# Patient Record
Sex: Female | Born: 1965 | Race: White | Hispanic: No | Marital: Married | Smoking: Never smoker
Health system: Southern US, Community
[De-identification: ages and names within clinical notes are randomized; demographics above are authoritative.]

## PROBLEM LIST (undated history)

## (undated) DIAGNOSIS — I10 Essential (primary) hypertension: Secondary | ICD-10-CM

## (undated) HISTORY — PX: DILATION AND CURETTAGE OF UTERUS: SHX78

## (undated) HISTORY — DX: Essential (primary) hypertension: I10

---

## 1998-03-14 ENCOUNTER — Encounter: Admission: RE | Admit: 1998-03-14 | Discharge: 1998-06-12 | Payer: Self-pay | Admitting: Orthopedic Surgery

## 1999-01-19 ENCOUNTER — Encounter: Admission: RE | Admit: 1999-01-19 | Discharge: 1999-02-06 | Payer: Self-pay | Admitting: Orthopedic Surgery

## 2002-03-31 ENCOUNTER — Emergency Department (HOSPITAL_COMMUNITY): Admission: EM | Admit: 2002-03-31 | Discharge: 2002-03-31 | Payer: Self-pay | Admitting: Emergency Medicine

## 2005-09-23 ENCOUNTER — Other Ambulatory Visit: Admission: RE | Admit: 2005-09-23 | Discharge: 2005-09-23 | Payer: Self-pay | Admitting: Obstetrics and Gynecology

## 2011-03-31 ENCOUNTER — Other Ambulatory Visit: Payer: Self-pay | Admitting: Obstetrics and Gynecology

## 2011-03-31 ENCOUNTER — Ambulatory Visit (HOSPITAL_COMMUNITY)
Admission: RE | Admit: 2011-03-31 | Discharge: 2011-03-31 | Disposition: A | Discharge status: Home / Self Care | Payer: 59 | Source: Ambulatory Visit | Attending: Obstetrics and Gynecology | Admitting: Obstetrics and Gynecology

## 2011-03-31 DIAGNOSIS — O021 Missed abortion: Secondary | ICD-10-CM | POA: Insufficient documentation

## 2011-03-31 LAB — CBC
HCT: 37.4 % (ref 36.0–46.0)
Hemoglobin: 12.7 g/dL (ref 12.0–15.0)
MCV: 92.6 fL (ref 78.0–100.0)
RBC: 4.04 MIL/uL (ref 3.87–5.11)
WBC: 5.2 10*3/uL (ref 4.0–10.5)

## 2011-04-02 NOTE — Op Note (Signed)
  NAMESHIRELY, TOREN NO.:  [id number]  MEDICAL RECORD NO.:  [id number]           PATIENT TYPE:  O  LOCATION:  [facility]                          [facility]  PHYSICIAN:  Zelphia Cairo, MD    DATE OF BIRTH:  02/15/1966  DATE OF PROCEDURE:  03/31/2011 DATE OF DISCHARGE:                              OPERATIVE REPORT   PREOPERATIVE DIAGNOSIS:  Missed abortion.  POSTOPERATIVE DIAGNOSIS:  Missed abortion.  PROCEDURES: 1. Cervical block. 2. D and E.  SURGEON:  Zelphia Cairo, MD  ANESTHESIA:  MAC with local.  SPECIMEN:  Products of conception.  ESTIMATED BLOOD LOSS:  Minimal.  COMPLICATIONS:  None.  CONDITION:  Stable to recovery room.  DESCRIPTION OF PROCEDURE:  The patient was taken to the operating room where anesthesia was found to be adequate.  She was placed in the dorsal lithotomy position using Allen stirrups, prepped and draped in sterile fashion.  An in-and-out catheter was used to drain her bladder.  Bivalve speculum was placed in the vagina.  A 1 mL of 1% lidocaine was placed at the anterior lip of the cervix and a single-tooth tenaculum was placed on the anterior lip of the cervix.  A 9 mL of 1% lidocaine was used then used to provide a cervical block.  The cervix was then serially dilated using Pratt dilators and the suction curette was used to remove products of conception.  A gentle curetting was then performed.  Once the uterine cry was ensured, one last pass with the suction catheter was performed to remove any clots and debris.  Tenaculum was removed from the cervix. The cervix was hemostatic.  The speculum was removed.  The patient was taken to the recovery room in stable condition.    Zelphia Cairo, MD    GA/MEDQ  D:  03/31/2011  T:  04/01/2011  Job:  [id number]  Electronically Signed by Zelphia Cairo MD on 04/02/2011 11:11:42 AM

## 2011-05-23 ENCOUNTER — Encounter: Payer: Self-pay | Admitting: *Deleted

## 2011-05-23 ENCOUNTER — Emergency Department (HOSPITAL_BASED_OUTPATIENT_CLINIC_OR_DEPARTMENT_OTHER)
Admission: EM | Admit: 2011-05-23 | Discharge: 2011-05-24 | Disposition: A | Discharge status: Home / Self Care | Payer: 59 | Attending: Emergency Medicine | Admitting: Emergency Medicine

## 2011-05-23 DIAGNOSIS — N898 Other specified noninflammatory disorders of vagina: Secondary | ICD-10-CM | POA: Insufficient documentation

## 2011-05-23 DIAGNOSIS — N939 Abnormal uterine and vaginal bleeding, unspecified: Secondary | ICD-10-CM

## 2011-05-23 LAB — DIFFERENTIAL
Basophils Absolute: 0 10*3/uL (ref 0.0–0.1)
Basophils Relative: 0 % (ref 0–1)
Eosinophils Absolute: 0.1 10*3/uL (ref 0.0–0.7)
Eosinophils Relative: 3 % (ref 0–5)
Lymphocytes Relative: 50 % — ABNORMAL HIGH (ref 12–46)
Lymphs Abs: 2.4 10*3/uL (ref 0.7–4.0)
Monocytes Absolute: 0.5 10*3/uL (ref 0.1–1.0)
Monocytes Relative: 11 % (ref 3–12)
Neutro Abs: 1.7 10*3/uL (ref 1.7–7.7)
Neutrophils Relative %: 36 % — ABNORMAL LOW (ref 43–77)

## 2011-05-23 LAB — URINALYSIS, ROUTINE W REFLEX MICROSCOPIC
Bilirubin Urine: NEGATIVE
Glucose, UA: NEGATIVE mg/dL
Hgb urine dipstick: NEGATIVE
Ketones, ur: NEGATIVE mg/dL
Leukocytes, UA: NEGATIVE
Nitrite: NEGATIVE
Protein, ur: NEGATIVE mg/dL
Specific Gravity, Urine: 1.01 (ref 1.005–1.030)
Urobilinogen, UA: 0.2 mg/dL (ref 0.0–1.0)
pH: 6.5 (ref 5.0–8.0)

## 2011-05-23 LAB — WET PREP, GENITAL
Clue Cells Wet Prep HPF POC: NONE SEEN
Trich, Wet Prep: NONE SEEN
Yeast Wet Prep HPF POC: NONE SEEN

## 2011-05-23 LAB — CBC
MCH: 31.2 pg (ref 26.0–34.0)
MCHC: 34.7 g/dL (ref 30.0–36.0)
MCV: 89.8 fL (ref 78.0–100.0)
Platelets: 217 10*3/uL (ref 150–400)
RBC: 4.01 MIL/uL (ref 3.87–5.11)

## 2011-05-23 LAB — PREGNANCY, URINE: Preg Test, Ur: NEGATIVE

## 2011-05-23 NOTE — ED Provider Notes (Signed)
History     Chief Complaint  Patient presents with  . Vaginal Bleeding   Patient is a 45 y.o. female presenting with vaginal bleeding.  Vaginal Bleeding This is a new problem. Episode onset: 3 days  ago. The problem occurs constantly. The problem has been gradually improving. Pertinent negatives include no abdominal pain, anorexia, chest pain, fatigue, fever, headaches, nausea, numbness, urinary symptoms, vertigo, vomiting or weakness. The symptoms are aggravated by nothing.  Vaginal Bleeding This is a new problem. Episode onset: 3 days  ago. The problem occurs constantly. The problem has been gradually improving. Pertinent negatives include no chest pain, no abdominal pain and no headaches. The symptoms are aggravated by nothing.  States she had a missed miscarraige and a D&C in May 2012. Reports recently diagnosed with BV and treated for it. States since she she began the ABx she has had vaginal bleeding. States that Reports yesterday using 2 "super" tampons an hour. States that today bleeding seemed to improve. But was recommended by the Dr. Garlan Fillers triage nurse to come to ED for evaluation of anemia. Dr. Garlan Fillers has her scheduled for an [place] in 2 weeks. LMP was 2 weeks ago  History reviewed. No pertinent past medical history.  Past Surgical History  Procedure Date  . Dilation and curettage of uterus     History reviewed. No pertinent family history.  History  Substance Use Topics  . Smoking status: Never Smoker   . Smokeless tobacco: Not on file  . Alcohol Use: Yes     occassional    OB History    Grav Para Term Preterm Abortions TAB SAB Ect Mult Living                  Review of Systems  Constitutional: Negative for fever and fatigue.  Cardiovascular: Negative for chest pain.  Gastrointestinal: Negative for nausea, vomiting, abdominal pain and anorexia.  Genitourinary: Positive for vaginal bleeding. Negative for dysuria, urgency, frequency, flank pain, vaginal pain and  pelvic pain.  Musculoskeletal: Negative for back pain.  Neurological: Negative for vertigo, weakness, light-headedness, numbness and headaches.    Physical Exam  BP 141/68  Pulse 72  Temp(Src) 98.6 F (37 C) (Oral)  Resp 20  SpO2 100%  Physical Exam  Constitutional: She is oriented to person, place, and time. She appears well-developed and well-nourished.  HENT:  Head: Atraumatic.  Eyes: Conjunctivae and EOM are normal. Pupils are equal, round, and reactive to light.  Neck: Normal range of motion. Neck supple.  Cardiovascular: Normal rate, regular rhythm and normal heart sounds.  Exam reveals no friction rub.   No murmur heard. Pulmonary/Chest: Effort normal and breath sounds normal. She has no wheezes. She has no rales. She exhibits no tenderness.  Abdominal: Soft. Bowel sounds are normal. She exhibits no distension and no mass. There is no tenderness. There is no rebound and no guarding.  Genitourinary: There is no tenderness or lesion on the right labia. There is no tenderness or lesion on the left labia. Uterus is enlarged. Uterus is not tender. Right adnexum displays no mass and no tenderness. Left adnexum displays no mass and no tenderness. There is bleeding around the vagina. No tenderness around the vagina.       Moderate amount of bright red blood in the vagina. No lacerations, or masses. Cervix pale. Os closed.   Musculoskeletal: Normal range of motion.  Neurological: She is alert and oriented to person, place, and time. Coordination normal.  Skin: Skin is  warm and dry. No rash noted. No erythema. No pallor.    ED Course  Procedures  MDM Labs WNL. Will schedule [place] for tomorrow. Uterus appears enlarged slightly on exam. Advised patient to return for worsened bleeding, dizziness, nausea, and abdominal/ pelvic pain. Pt and family agree on plan and are ready for dc   Medical screening examination/treatment/procedure(s) were performed by non-physician practitioner and as  supervising physician I was immediately available for consultation/collaboration.    Monica Best, [place] 05/26/11 1037  Shelda Jakes, MD 06/25/11 [phone]

## 2011-05-23 NOTE — ED Notes (Signed)
Pt state that she was treated for BV and since has had vaginal bleeding and painful intercourse pt called her oncall RN and was advised to come to ED

## 2011-05-24 ENCOUNTER — Ambulatory Visit (HOSPITAL_BASED_OUTPATIENT_CLINIC_OR_DEPARTMENT_OTHER)
Admission: RE | Admit: 2011-05-24 | Discharge: 2011-05-24 | Disposition: A | Discharge status: Home / Self Care | Payer: 59 | Source: Ambulatory Visit | Attending: Emergency Medicine | Admitting: Emergency Medicine

## 2011-05-24 ENCOUNTER — Ambulatory Visit (INDEPENDENT_AMBULATORY_CARE_PROVIDER_SITE_OTHER)
Admit: 2011-05-24 | Discharge: 2011-05-24 | Disposition: A | Discharge status: Home / Self Care | Payer: 59 | Attending: Emergency Medicine | Admitting: Emergency Medicine

## 2011-05-24 DIAGNOSIS — N898 Other specified noninflammatory disorders of vagina: Secondary | ICD-10-CM

## 2011-05-24 DIAGNOSIS — N854 Malposition of uterus: Secondary | ICD-10-CM | POA: Insufficient documentation

## 2011-05-24 DIAGNOSIS — N83209 Unspecified ovarian cyst, unspecified side: Secondary | ICD-10-CM | POA: Insufficient documentation

## 2011-05-25 LAB — GC/CHLAMYDIA PROBE AMP, GENITAL: Chlamydia, DNA Probe: NEGATIVE

## 2011-06-22 ENCOUNTER — Other Ambulatory Visit (HOSPITAL_COMMUNITY): Payer: Self-pay | Admitting: Gynecology

## 2011-06-22 DIAGNOSIS — N979 Female infertility, unspecified: Secondary | ICD-10-CM

## 2011-06-29 ENCOUNTER — Ambulatory Visit (HOSPITAL_COMMUNITY): Payer: 59

## 2011-06-29 ENCOUNTER — Ambulatory Visit (HOSPITAL_COMMUNITY)
Admission: RE | Admit: 2011-06-29 | Discharge: 2011-06-29 | Disposition: A | Discharge status: Home / Self Care | Payer: 59 | Source: Ambulatory Visit | Attending: Gynecology | Admitting: Gynecology

## 2011-06-29 DIAGNOSIS — N979 Female infertility, unspecified: Secondary | ICD-10-CM | POA: Insufficient documentation

## 2011-06-29 MED ORDER — IOHEXOL 300 MG/ML  SOLN: 30.0000 mL | Freq: Once | INTRAMUSCULAR | Status: AC | PRN

## 2014-08-01 ENCOUNTER — Other Ambulatory Visit: Payer: Self-pay | Admitting: Obstetrics and Gynecology

## 2014-08-02 LAB — CYTOLOGY - PAP

## 2018-01-16 NOTE — Progress Notes (Signed)
Cardiology Office Note   Date:  01/20/2018   ID:  Monica SaltJennifer Dameron Carriker, DOB February 17, 1966, MRN [id number]  PCP: Seabron SpatesNancy Yount NP [place] Cardiologist:   Gae Bihl [place], MD   Chief Complaint  Patient presents with  . Follow-up    NP.  Marland Kitchen. Chest Pain  . Headache  . Shortness of Breath  . Hypertension      History of Present Illness: Monica Best is a 52 y.o. female who is seen at the request of Lajuana [place]atalie [place]  for evaluation of HTN and chest pain. She states she was in good health until October 2018 when she saw her eye doctor and was noted to have severe HTN with BP 220/100. She states previously her BP was low during pregnancies. She was started on BP medication initially with lisinopril and then later switched to Benicar. Was seen by Dr. Gwenette GreetMichael Conley [place]. Medication dose increased. Later amlodipine and HCTZ added.   She states she just doses not feel well. Complaints of chest pain and tightness in the center of her chest radiating to her jaw and side of neck. She has SOB just talking sometimes. She feels heart racing at times with her Apple watch showing increased HR to 150. Further work up by primary care includes renal duplex. She also just wore a 7 day heart monitor. Results are not known although patient reports kidney scan was OK. She owns a [employer]. She eats very healthy and avoids salt. She used to exercise daily but hasn't recently.   With the recent change in her medication her BP control has improved. She brings a diary indicating systolic readings of 125-148 and diastolic 75-97 the past 2-3 weeks. She does have a family history of early CAD with father having a cardiac event at age 50. She has no history of DM, HLD, or smoking.     Past Medical History:  Diagnosis Date  . Hypertension     Past Surgical History:  Procedure Laterality Date  . DILATION AND CURETTAGE OF UTERUS       Current Outpatient Medications    Medication Sig Dispense Refill  . butalbital-acetaminophen-caffeine (FIORICET, ESGIC) 50-325-40 MG tablet Take 1 tablet by mouth daily as needed.    Marland Kitchen. amLODipine (NORVASC) 10 MG tablet Take 10 mg by mouth daily.  0  . hydrochlorothiazide (HYDRODIURIL) 12.5 MG tablet Take 12.5 mg by mouth daily.  0  . Multiple Vitamin (MULTIVITAMIN) tablet Take 1 tablet by mouth daily.    Marland Kitchen. olmesartan (BENICAR) 40 MG tablet Take 40 mg by mouth daily.  0   No current facility-administered medications for this visit.     Allergies:   Patient has no known allergies.    Social History:  The patient  reports that  has never smoked. she has never used smokeless tobacco. She reports that she drinks about 0.6 oz of alcohol per week. She reports that she does not use drugs.   Family History:  The patient's family history includes Cancer in her brother; Diabetes in her father; Heart attack in her father; Heart disease (age of onset: 10052) in her father; Hypertension in her mother.    ROS:  Please see the history of present illness.   Otherwise, review of systems are positive for none.   All other systems are reviewed and negative.    PHYSICAL EXAM: VS:  BP (!) 142/70   Pulse 72   Ht 5\' 4"  (1.626 m)  Wt 141 lb (64 kg)   BMI 24.20 kg/m  , BMI Body mass index is 24.2 kg/m. GEN: Well nourished, well developed, in no acute distress  HEENT: normal  Neck: no JVD, carotid bruits, or masses Cardiac: RRR; no murmurs, rubs, or gallops,no edema  Respiratory:  clear to auscultation bilaterally, normal work of breathing GI: soft, nontender, nondistended, + BS MS: no deformity or atrophy  Skin: warm and dry, no rash Neuro:  Strength and sensation are intact Psych: euthymic mood, full affect   EKG:  EKG is ordered today. The ekg ordered today demonstrates NSR rate 72, normal Ecg.    Recent Labs: No results found for requested labs within last 8760 hours.    Lipid Panel No results found for: CHOL, TRIG, HDL,  CHOLHDL, VLDL, LDLCALC, LDLDIRECT    Wt Readings from Last 3 Encounters:  01/20/18 141 lb (64 kg)      Other studies Reviewed: Additional studies/ records that were reviewed today include:   Labs reported from primary care: cholesterol 163, triglycerides 66, HDL 75, LDL 75. Normal chemistries, CBC, A1c, and TSH.    ASSESSMENT AND PLAN:  1.  Chest pain. Patient is very anxious about cardiac risk. Risk factors include HTN and family history of premature CAD. Will arrange for a stress myoview.   2. HTN - control improved now on triple therapy. Apparently renal duplex was normal. Will request a copy. Will obtain 24 hour urine for metanephrines and catecholamines. If normal then it is most likely she just has essential HTN. Will will check a BMET today on HCTZ.   3. Palpitations. Will await results of recent monitor.    Current medicines are reviewed at length with the patient today.  The patient does not have concerns regarding medicines.  The following changes have been made:  no change  Labs/ tests ordered today include:   Orders Placed This Encounter  Procedures  . Basic metabolic panel  . Metanephrines, Urine, 24 hour  . Catecholamines, fractionated, Urine, 24 hour  . Catecholamines, fractionated, Urine, 24 hour  . Myocardial Perfusion Imaging  . EKG 12-Lead     Disposition:   FU with me in 3 months  Signed, Dammon Makarewicz [place], MD  01/20/2018 11:21 AM    [facility] [address] [place], [place], [address] Phone [phone], Fax [phone]

## 2018-01-20 ENCOUNTER — Encounter: Payer: Self-pay | Admitting: Cardiology

## 2018-01-20 ENCOUNTER — Ambulatory Visit (INDEPENDENT_AMBULATORY_CARE_PROVIDER_SITE_OTHER): Payer: BLUE CROSS/BLUE SHIELD | Admitting: Cardiology

## 2018-01-20 ENCOUNTER — Encounter (INDEPENDENT_AMBULATORY_CARE_PROVIDER_SITE_OTHER): Payer: Self-pay

## 2018-01-20 VITALS — BP 142/70 | HR 72 | Ht 64.0 in | Wt 141.0 lb

## 2018-01-20 DIAGNOSIS — I10 Essential (primary) hypertension: Secondary | ICD-10-CM

## 2018-01-20 DIAGNOSIS — R072 Precordial pain: Secondary | ICD-10-CM | POA: Diagnosis not present

## 2018-01-20 DIAGNOSIS — R002 Palpitations: Secondary | ICD-10-CM

## 2018-01-20 LAB — BASIC METABOLIC PANEL
BUN/Creatinine Ratio: 30 — ABNORMAL HIGH (ref 9–23)
BUN: 19 mg/dL (ref 6–24)
CO2: 25 mmol/L (ref 20–29)
Calcium: 9.3 mg/dL (ref 8.7–10.2)
Chloride: 103 mmol/L (ref 96–106)
Creatinine, Ser: 0.64 mg/dL (ref 0.57–1.00)
GFR calc Af Amer: 121 mL/min/{1.73_m2} (ref >59)
GFR calc non Af Amer: 105 mL/min/{1.73_m2} (ref >59)
GLUCOSE: 93 mg/dL (ref 65–99)
Potassium: 4.3 mmol/L (ref 3.5–5.2)
Sodium: 141 mmol/L (ref 134–144)

## 2018-01-20 NOTE — Patient Instructions (Addendum)
Lab today  ( bmet,24 hour urine for metanephrine,catecholamines )    Schedule stress myoview     ( no meds to hold )

## 2018-01-25 ENCOUNTER — Telehealth (HOSPITAL_COMMUNITY): Payer: Self-pay

## 2018-01-25 NOTE — Telephone Encounter (Signed)
Encounter complete. 

## 2018-01-27 ENCOUNTER — Ambulatory Visit (HOSPITAL_COMMUNITY)
Admission: RE | Admit: 2018-01-27 | Discharge: 2018-01-27 | Disposition: A | Discharge status: Home / Self Care | Payer: BLUE CROSS/BLUE SHIELD | Source: Ambulatory Visit | Attending: Cardiovascular Disease | Admitting: Cardiovascular Disease

## 2018-01-27 DIAGNOSIS — R0602 Shortness of breath: Secondary | ICD-10-CM | POA: Diagnosis not present

## 2018-01-27 DIAGNOSIS — I10 Essential (primary) hypertension: Secondary | ICD-10-CM

## 2018-01-27 DIAGNOSIS — Z8249 Family history of ischemic heart disease and other diseases of the circulatory system: Secondary | ICD-10-CM | POA: Insufficient documentation

## 2018-01-27 DIAGNOSIS — R002 Palpitations: Secondary | ICD-10-CM | POA: Diagnosis not present

## 2018-01-27 DIAGNOSIS — R072 Precordial pain: Secondary | ICD-10-CM | POA: Diagnosis not present

## 2018-01-27 MED ORDER — TECHNETIUM TC 99M TETROFOSMIN IV KIT
30.2000 | PACK | Freq: Once | INTRAVENOUS | Status: AC | PRN
  Administered 2018-01-27: 30.2 via INTRAVENOUS
  Filled 2018-01-27: qty 31

## 2018-01-27 MED ORDER — TECHNETIUM TC 99M TETROFOSMIN IV KIT
9.9000 | PACK | Freq: Once | INTRAVENOUS | Status: AC | PRN
  Administered 2018-01-27: 9.9 via INTRAVENOUS
  Filled 2018-01-27: qty 10

## 2018-01-30 LAB — MYOCARDIAL PERFUSION IMAGING
CHL RATE OF PERCEIVED EXERTION: 18
CSEPED: 12 min
CSEPEDS: 30 s
Estimated workload: 14.1 METS
LV dias vol: 106 mL (ref 46–106)
LV sys vol: 33 mL
MPHR: 171 {beats}/min
Peak HR: 169 {beats}/min
Percent HR: 98 %
Rest HR: 71 {beats}/min
SDS: 1
SRS: 0
SSS: 1
TID: 1.03

## 2018-01-31 LAB — CATECHOLAMINES, FRACTIONATED, URINE, 24 HOUR
Dopamine , 24H Ur: 133 ug/24 hr (ref 0–510)
Dopamine, Rand Ur: 133 ug/L
EPINEPHRINE 24H UR: 5 ug/(24.h) (ref 0–20)
EPINEPHRINE RAND UR: 5 ug/L
Norepinephrine, 24H Ur: 35 ug/24 hr (ref 0–135)
Norepinephrine, Rand Ur: 35 ug/L

## 2018-02-01 LAB — METANEPHRINES, URINE, 24 HOUR
METANEPHRINES 24H UR: 39 ug/(24.h) — AB (ref 45–290)
Metaneph Total, Ur: 77 ug/L
NORMETANEPHRINE UR: 329 ug/L
Normetanephrine, 24H Ur: 165 ug/24 hr (ref 82–500)

## 2018-02-03 ENCOUNTER — Encounter: Payer: Self-pay | Admitting: Cardiology

## 2018-11-16 IMAGING — NM NM MISC PROCEDURE
9 series · 54 of 54 positions shown · non-contrast
Comparison: none

[Series 1: rest sax · 6.4mm · 6.40mm/px · 6 of 23 frames shown]
[frame 2/23]
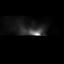
[frame 6/23]
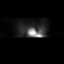
[frame 10/23]
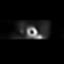
[frame 14/23]
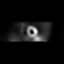
[frame 18/23]
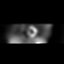
[frame 22/23]
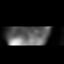

[Series 1: wbr rest · 6.40mm/px · 6 of 64 frames shown]
[frame 6/64]
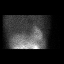
[frame 16/64]
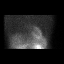
[frame 27/64]
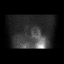
[frame 38/64]
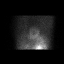
[frame 48/64]
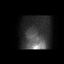
[frame 59/64]
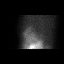

[Series 1: wbr_r-proj_st wbr rest · 6.40mm/px · 6 of 64 frames shown]
[frame 6/64]
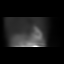
[frame 16/64]
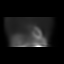
[frame 27/64]
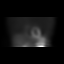
[frame 38/64]
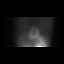
[frame 48/64]
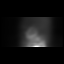
[frame 59/64]
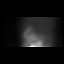

[Series 2: wbr stress-gsp · 6.40mm/px · 6 of 511 frames shown]
[frame 43/511  full-range]
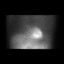
[frame 128/511  full-range]
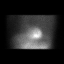
[frame 213/511  full-range]
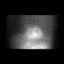
[frame 298/511  full-range]
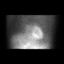
[frame 383/511  full-range]
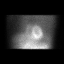
[frame 469/511  full-range]
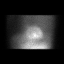

[Series 2: stress sax gs · 6.4mm · 6.40mm/px · 6 of 184 frames shown]
[frame 16/184]
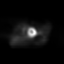
[frame 46/184]
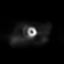
[frame 77/184]
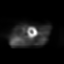
[frame 108/184]
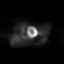
[frame 138/184]
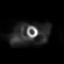
[frame 169/184]
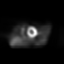

[Series 2: wbr_s-proj_st wbr stress-gsp · 6.40mm/px · 6 of 512 frames shown]
[frame 43/512]
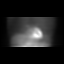
[frame 128/512]
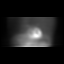
[frame 214/512]
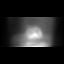
[frame 299/512]
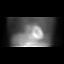
[frame 384/512]
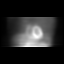
[frame 470/512]
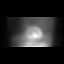

[Series 3: wbr stress-sum-em · 6.40mm/px · 6 of 64 frames shown]
[frame 6/64]
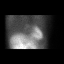
[frame 16/64]
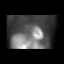
[frame 27/64]
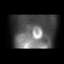
[frame 38/64]
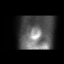
[frame 48/64]
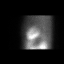
[frame 59/64]
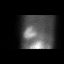

[Series 3: stress sax · 6.4mm · 6.40mm/px · 6 of 23 frames shown]
[frame 2/23]
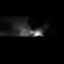
[frame 6/23]
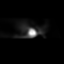
[frame 10/23]
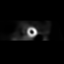
[frame 14/23]
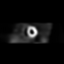
[frame 18/23]
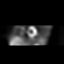
[frame 22/23]
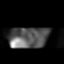

[Series 3: wbr_s-proj_st wbr stress-sum-em · 6.40mm/px · 6 of 64 frames shown]
[frame 6/64]
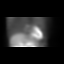
[frame 16/64]
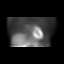
[frame 27/64]
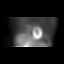
[frame 38/64]
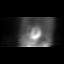
[frame 48/64]
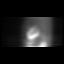
[frame 59/64]
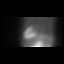

[54 of 54 positions shown; findings below may reference images not displayed]

Canned report from images found in remote index.

Refer to host system for actual result text.
